# Patient Record
Sex: Male | Born: 2000 | Race: White | Hispanic: No | Marital: Single | Smoking: Current every day smoker
Health system: Southern US, Community
[De-identification: ages and names within clinical notes are randomized; demographics above are authoritative.]

---
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
## 2020-11-17 IMAGING — DX DG CHEST 2V
2 series · 2 of 2 positions shown · non-contrast
Comparison: None.

CLINICAL DATA: Shortness of breath, fever and cough for 11 days.

EXAM:
CHEST - 2 VIEW

[chest pa]
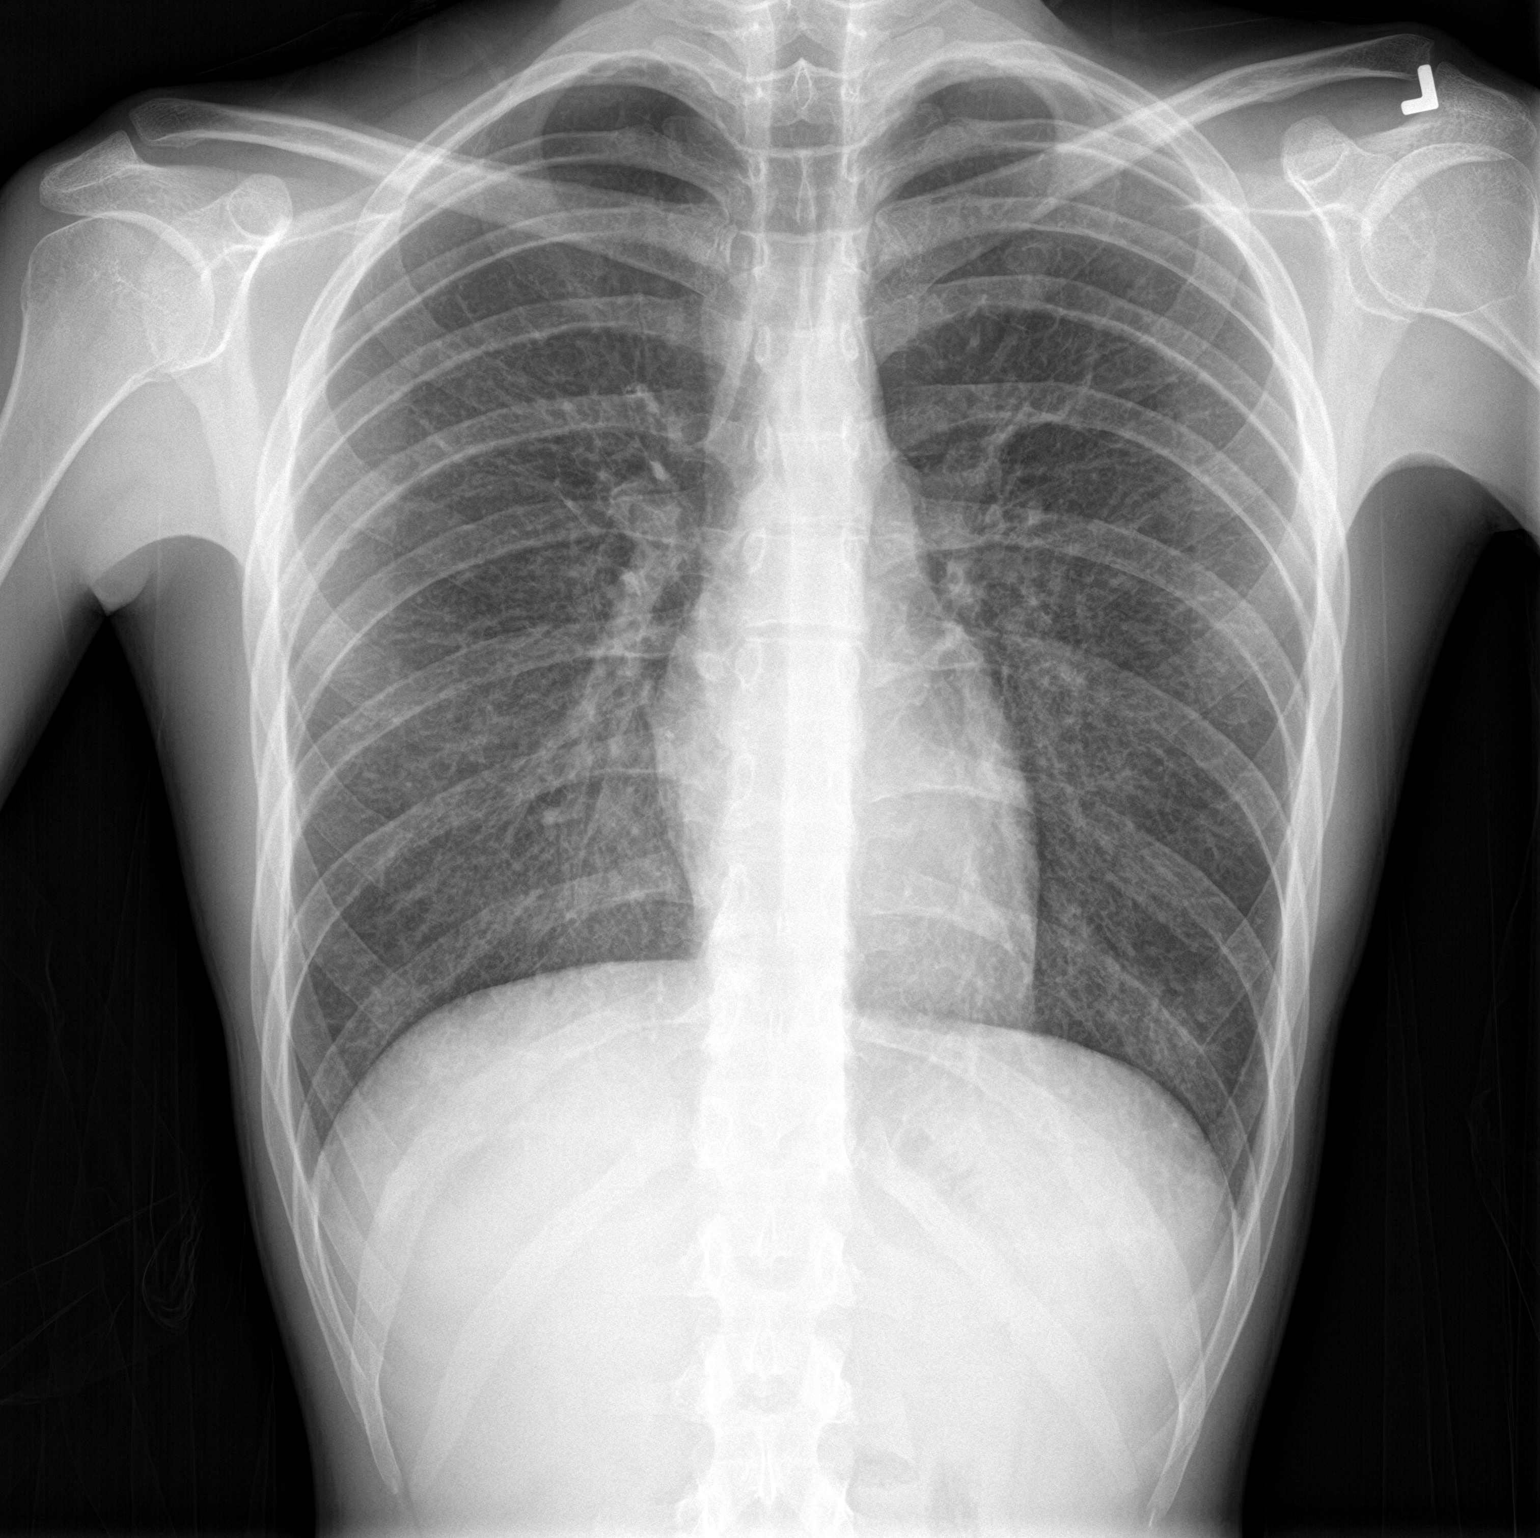

[chest lat]
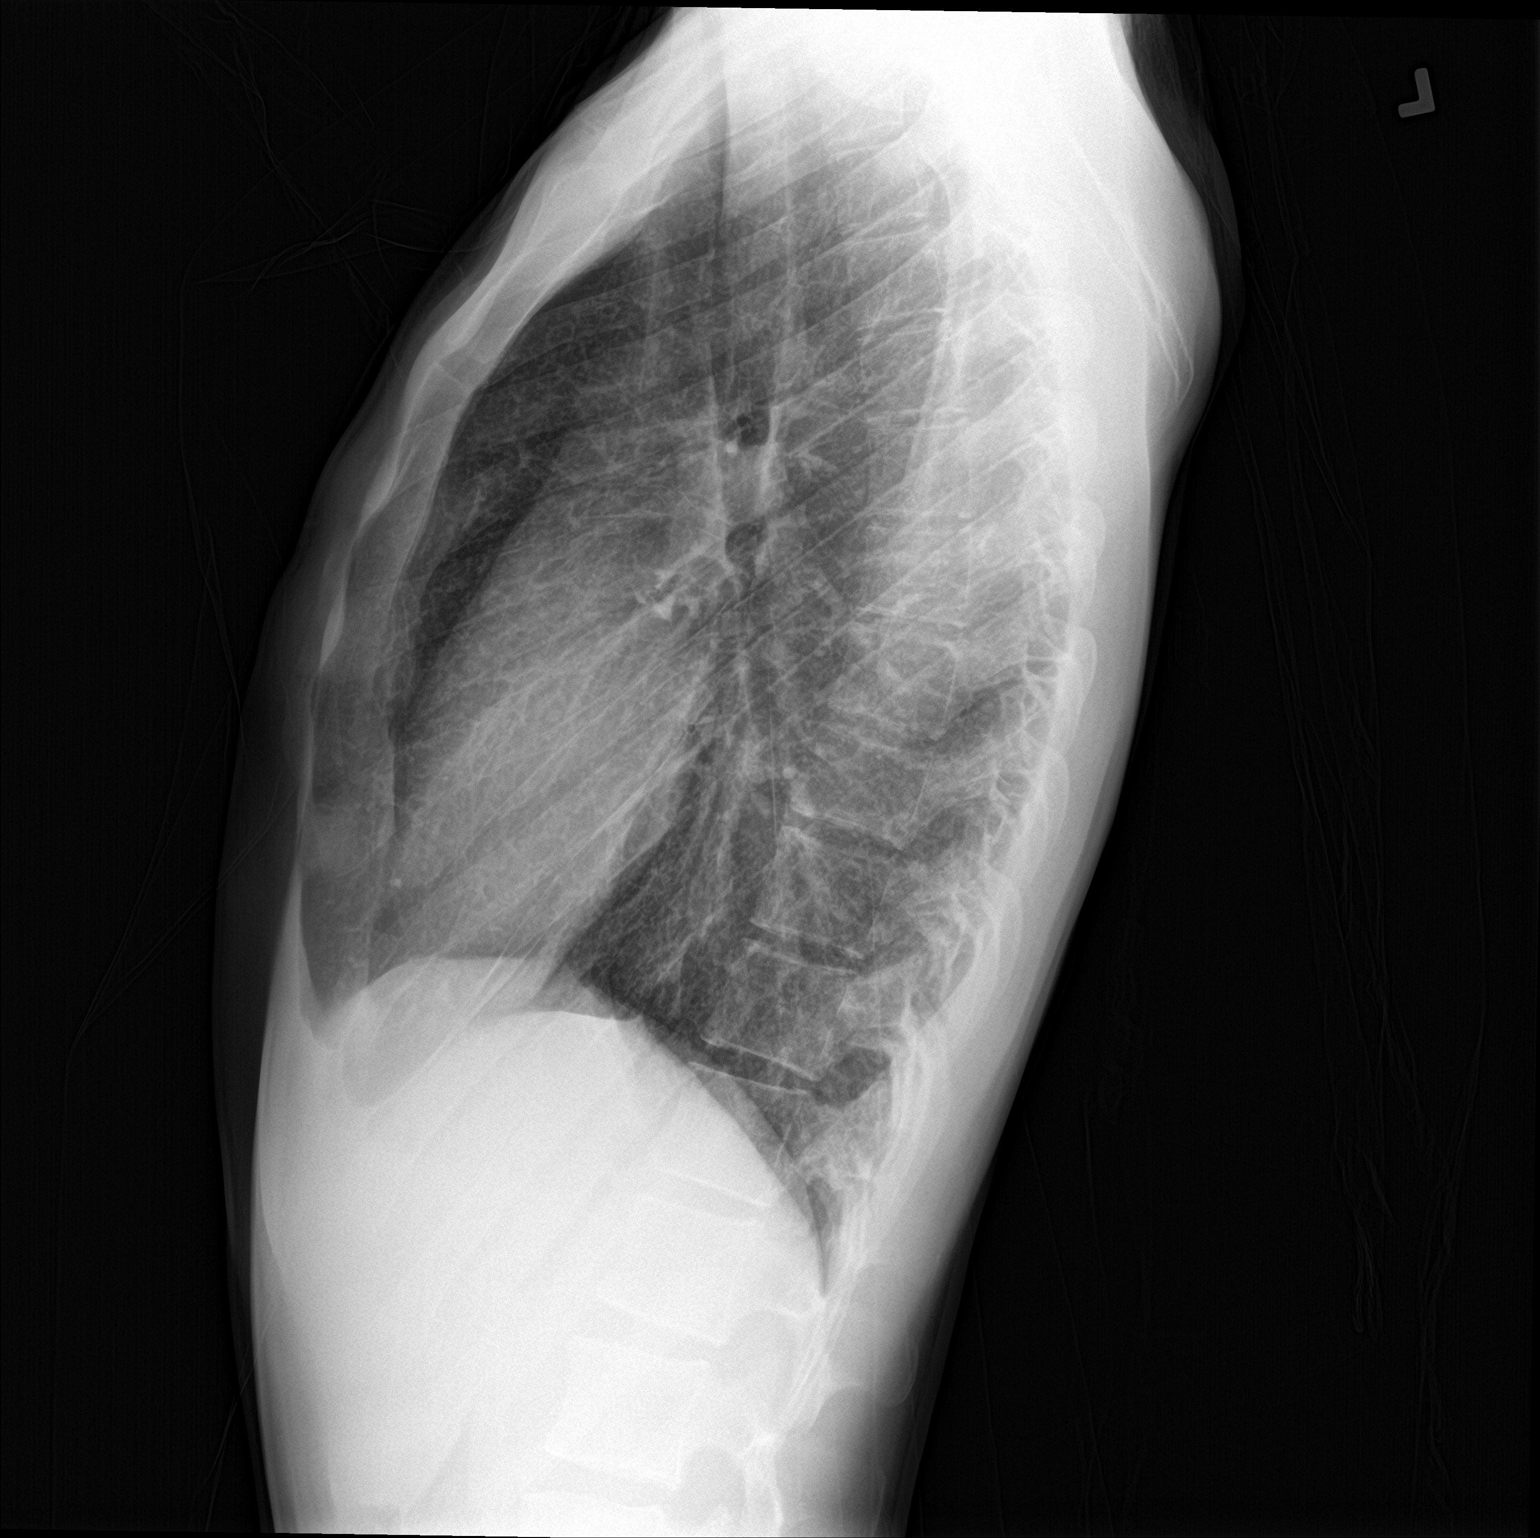

[2 of 2 positions shown; findings below may reference images not displayed]

FINDINGS: The lungs are clear. Peribronchial thickening is identified. No
pneumothorax or pleural fluid. No acute or focal bony abnormality.
IMPRESSION: Findings compatible with bronchitis.  Otherwise negative.

## 2023-06-20 ENCOUNTER — Other Ambulatory Visit: Payer: Self-pay

## 2023-06-20 ENCOUNTER — Ambulatory Visit (INDEPENDENT_AMBULATORY_CARE_PROVIDER_SITE_OTHER): Payer: Self-pay

## 2023-06-20 ENCOUNTER — Ambulatory Visit
Admission: RE | Admit: 2023-06-20 | Discharge: 2023-06-20 | Disposition: A | Discharge status: Home / Self Care | Payer: Self-pay | Source: Ambulatory Visit | Attending: Family Medicine | Admitting: Family Medicine

## 2023-06-20 VITALS — BP 98/64 | HR 59 | Temp 98.5°F | Resp 16

## 2023-06-20 DIAGNOSIS — S90931A Unspecified superficial injury of right great toe, initial encounter: Secondary | ICD-10-CM

## 2023-06-20 DIAGNOSIS — S92424A Nondisplaced fracture of distal phalanx of right great toe, initial encounter for closed fracture: Secondary | ICD-10-CM

## 2023-06-20 NOTE — ED Provider Notes (Addendum)
 [facility]    CSN: [id number] Arrival date & time: 06/20/23  0915      History   Chief Complaint Chief Complaint  Patient presents with   Foot Injury    Great Toe, RT    HPI Marcus Aguirre is a 23 y.o. male.   Patient states he dropped a heavy piece of marble on his foot yesterday.  His right great toe is swollen, painful, and bruised for x-rays Patient states this happened at work but he did not reported.  I recommended that he reported to his supervisor so that he could obtain appropriate light duty and care    History reviewed. No pertinent past medical history.  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 No pertinent surgical history.     Home Medications    Prior to Admission medications   Medication Sig Start Date End Date Taking? Authorizing Provider  ibuprofen (ADVIL) 200 MG tablet Take 200 mg by mouth every 6 (six) hours as needed.    [provider]    Family History Family History  Problem Relation Age of Onset   Healthy Father     Social History Social History   Tobacco Use   Smoking status: Every Day    Types: E-cigarettes   Smokeless tobacco: Never  Vaping Use   Vaping status: Some Days   Substances: THC  Substance Use Topics   Alcohol use: Not Currently   Drug use: Not Currently     Allergies   Patient has no known allergies.   Review of Systems Review of Systems See HPI  Physical Exam Triage Vital Signs ED Triage Vitals  Encounter Vitals Group     BP --      Systolic BP Percentile --      Diastolic BP Percentile --      Pulse Rate 06/20/23 0923 (!) 59     Resp 06/20/23 0923 16     Temp 06/20/23 0923 98.5 F (36.9 C)     Temp Source 06/20/23 0923 Oral     SpO2 06/20/23 0923 97 %     Weight --      Height --      Head Circumference --      Peak Flow --      Pain Score 06/20/23 0929 4     Pain Loc --      Pain Education --       Exclude from Growth Chart --    No data found.  Updated Vital Signs BP 98/64   Pulse (!) 59   Temp 98.5 F (36.9 C) (Oral)   Resp 16   SpO2 97%       Physical Exam Musculoskeletal:       Feet:      UC Treatments / Results  Labs (all labs ordered are listed, but only abnormal results are displayed) Labs Reviewed - No data to display  EKG   Radiology DG Toe Great Right Result Date: 06/20/2023 CLINICAL DATA:  Trauma to the right great toe. EXAM: RIGHT GREAT TOE COMPARISON:  None Available. FINDINGS: Nondisplaced cortical fracture of the lateral aspect of the proximal phalanx of the great toe adjacent to the interphalangeal joint. No other acute fracture. The bones are well mineralized. No dislocation. Mild soft tissue swelling of the great toe. No radiopaque foreign object or soft tissue gas. IMPRESSION: Nondisplaced cortical fracture of the proximal phalanx  of the great toe. Electronically Signed   By: Vanetta Chou M.D.   On: 06/20/2023 10:09    Procedures Procedures (including critical care time)  Medications Ordered in UC Medications - No data to display  Initial Impression / Assessment and Plan / UC Course  I have reviewed the triage vital signs and the nursing notes.  Pertinent labs & imaging results that were available during my care of the patient were reviewed by me and considered in my medical decision making (see chart for details).     Toe fracture discussed.  Needs follow-up with Worker's Comp. Final Clinical Impressions(s) / UC Diagnoses   Final diagnoses:  Closed nondisplaced fracture of distal phalanx of right great toe, initial encounter     Discharge Instructions      You need to wear a fracture shoe while walking Buddy tape toes Take Tylenol or ibuprofen for pain See an orthopedic in follow-up I recommend you report this to your employer     ED Prescriptions   None    PDMP not reviewed this encounter.   Maranda Jamee Jacob,  MD 06/20/23 1011    Maranda Jamee Jacob, MD 06/20/23 [phone]

## 2023-06-20 NOTE — Discharge Instructions (Signed)
 You need to wear a fracture shoe while walking Buddy tape toes Take Tylenol or ibuprofen for pain See an orthopedic in follow-up I recommend you report this to your employer

## 2023-06-20 NOTE — ED Triage Notes (Addendum)
 Pt c/o injury to RT great toe that occurred yesterday. Says he dropped a marble tile on it. Bruising noted.

## 2023-10-12 DIAGNOSIS — M79671 Pain in right foot: Secondary | ICD-10-CM | POA: Diagnosis not present

## 2023-10-12 DIAGNOSIS — S92404S Nondisplaced unspecified fracture of right great toe, sequela: Secondary | ICD-10-CM | POA: Diagnosis not present

## 2023-10-12 DIAGNOSIS — M25674 Stiffness of right foot, not elsewhere classified: Secondary | ICD-10-CM | POA: Diagnosis not present

## 2023-10-12 DIAGNOSIS — S90111S Contusion of right great toe without damage to nail, sequela: Secondary | ICD-10-CM | POA: Diagnosis not present

## 2023-11-30 ENCOUNTER — Ambulatory Visit: Payer: Self-pay | Admitting: Urgent Care

## 2023-11-30 VITALS — BP 115/73 | HR 60 | Resp 18 | Ht 72.0 in | Wt 108.0 lb

## 2023-11-30 DIAGNOSIS — Z7689 Persons encountering health services in other specified circumstances: Secondary | ICD-10-CM | POA: Diagnosis not present

## 2023-11-30 DIAGNOSIS — Z113 Encounter for screening for infections with a predominantly sexual mode of transmission: Secondary | ICD-10-CM

## 2023-11-30 DIAGNOSIS — Z23 Encounter for immunization: Secondary | ICD-10-CM | POA: Diagnosis not present

## 2023-11-30 NOTE — Patient Instructions (Signed)
 We completed labs for your today. We updated your tetanus vaccine today. Try to cut back on nicotine.  Congrats on the baby!!  Return annually or as needed

## 2023-11-30 NOTE — Progress Notes (Signed)
 New Patient Office Visit  Subjective:  Patient ID: Marcus Aguirre, male    DOB: 2000/09/13  Age: 23 y.o. MRN: [id number]  CC:  Chief Complaint  Patient presents with   New Patient (Initial Visit)    Est. Care pt would like to have blood work done     HPI Marcus Aguirre presents to establish care.  Discussed the use of AI scribe software for clinical note transcription with the patient, who gave verbal consent to proceed.  History of Present Illness   Marcus Aguirre is a 23 year old male who presents for establishing care and routine blood work. He is accompanied by his girlfriend.  He has no specific medical concerns and is not currently on any prescription medications. He is interested in confirming his vaccination status, particularly for the tetanus vaccine, and requests an STD screening as a precautionary measure, without any known exposure or symptoms.  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 He enjoys playing video games and driving as hobbies. He maintains a diet of approximately three meals a day, though not consistently, and generally gets six to eight hours of sleep, despite having a one-month-old baby.  He has a history of smoking, having started at age 62. Currently, he vapes frequently and smokes marijuana occasionally, though he has reduced his marijuana use recently. He wants to quit vaping eventually. He denies any alcohol use and has no history of illicit drug use.  He has a birthmark on his neck, present since birth, which is not painful or growing. His father had a similar birthmark, which was removed when he was younger.  No respiratory issues, history of asthma, or recent weight loss. No known allergies.       Outpatient Encounter Medications as of 11/30/2023  Medication Sig   ibuprofen (ADVIL) 200 MG tablet Take 200 mg by mouth every 6 (six) hours as needed.   No facility-administered encounter medications on file as of 11/30/2023.     History reviewed. No pertinent past medical history.  History reviewed. No pertinent surgical history.  Family History  Problem Relation Age of Onset   Healthy Father     Social History   Socioeconomic History   Marital status: Single    Spouse name: Not on file   Number of children: Not on file   Years of education: Not on file   Highest education level: Not on file  Occupational History   Not on file  Tobacco Use   Smoking status: Every Day    Types: E-cigarettes   Smokeless tobacco: Never  Vaping Use   Vaping status: Some Days   Substances: THC  Substance and Sexual Activity   Alcohol use: Not Currently   Drug use: Not Currently   Sexual activity: Not on file  Other Topics Concern   Not on file  Social History Narrative   Not on file   Social Drivers of Health   Financial Resource Strain: Not on file  Food Insecurity: Not on file  Transportation Needs: Not on file  Physical Activity: Not on file  Stress: Not on file  Social Connections: Not on file  Intimate Partner Violence: Not on file    ROS: as noted in HPI  Objective:  BP 115/73 (BP Location: Left Arm, Patient Position: Sitting, Cuff Size: Normal)   Pulse 60   Resp 18   Ht 6' (1.829 m)   Wt 108 lb (49 kg)   SpO2 99%  BMI 14.65 kg/m   Physical Exam Vitals and nursing note reviewed.  Constitutional:      General: He is not in acute distress.    Appearance: Normal appearance. He is not ill-appearing, toxic-appearing or diaphoretic.     Comments: Very thin  HENT:     Head: Normocephalic and atraumatic.     Right Ear: Tympanic membrane, ear canal and external ear normal. There is no impacted cerumen.     Left Ear: Tympanic membrane, ear canal and external ear normal. There is no impacted cerumen.     Nose: Nose normal.     Mouth/Throat:     Mouth: Mucous membranes are moist.     Pharynx: Oropharynx is clear. No oropharyngeal exudate or posterior oropharyngeal erythema.   Eyes:      General: No scleral icterus.       Right eye: No discharge.        Left eye: No discharge.     Extraocular Movements: Extraocular movements intact.     Pupils: Pupils are equal, round, and reactive to light.   Neck:     Thyroid: No thyroid mass, thyromegaly or thyroid tenderness.   Cardiovascular:     Rate and Rhythm: Normal rate and regular rhythm.     Pulses: Normal pulses.     Heart sounds: No murmur heard. Pulmonary:     Effort: Pulmonary effort is normal. No respiratory distress.     Breath sounds: Normal breath sounds. No stridor. No wheezing or rhonchi.  Abdominal:     General: Abdomen is flat. Bowel sounds are normal. There is no distension.     Palpations: Abdomen is soft. There is no mass.     Tenderness: There is no abdominal tenderness. There is no guarding.   Musculoskeletal:     Cervical back: Normal range of motion and neck supple. No rigidity or tenderness.     Right lower leg: No edema.     Left lower leg: No edema.  Lymphadenopathy:     Cervical: No cervical adenopathy.   Skin:    General: Skin is warm and dry.     Coloration: Skin is not jaundiced.     Findings: No bruising, erythema or rash.     Comments: Two pedunculated calcified skin nodules on the L neck and one on the R   Neurological:     General: No focal deficit present.     Mental Status: He is alert and oriented to person, place, and time.     Sensory: No sensory deficit.     Motor: No weakness.   Psychiatric:        Mood and Affect: Mood normal.        Behavior: Behavior normal.       Assessment & Plan:  Screen for STD (sexually transmitted disease) -     Hepatitis C antibody -     HIV Antibody (routine testing w rflx)  Encounter to establish care -     Lipid panel -     CBC -     TSH -     CMP14+EGFR  Encounter for immunization -     Tdap vaccine greater than or equal to 7yo IM  Assessment and Plan    General Health Maintenance 23 year old male establishing care.  Generally healthy, no current medical concerns. Smokes nicotine vapes, desires cessation. Reduced marijuana use. Due for vaccinations, especially tetanus due to newborn at home. Interested in STD screening for precaution. - Check vaccination  status, administer tetanus vaccine if due, emphasized pertussis prevention for newborn. - Perform STD screening. - Encouraged smoking cessation - Schedule annual wellness visits unless lab results indicate otherwise.        No follow-ups on file.   Benton LITTIE Gave, PA

## 2023-12-01 ENCOUNTER — Encounter: Payer: Self-pay | Admitting: Urgent Care

## 2023-12-01 ENCOUNTER — Ambulatory Visit: Payer: Self-pay | Admitting: Urgent Care

## 2023-12-01 LAB — CMP14+EGFR
ALT: 11 IU/L (ref 0–44)
AST: 25 IU/L (ref 0–40)
Albumin: 5.2 g/dL (ref 4.3–5.2)
Alkaline Phosphatase: 56 IU/L (ref 44–121)
BUN/Creatinine Ratio: 19 (ref 9–20)
BUN: 19 mg/dL (ref 6–20)
Bilirubin Total: 1.4 mg/dL — ABNORMAL HIGH (ref 0.0–1.2)
CO2: 22 mmol/L (ref 20–29)
Calcium: 10.1 mg/dL (ref 8.7–10.2)
Chloride: 101 mmol/L (ref 96–106)
Creatinine, Ser: 1 mg/dL (ref 0.76–1.27)
Globulin, Total: 2.7 g/dL (ref 1.5–4.5)
Glucose: 74 mg/dL (ref 70–99)
Potassium: 4.7 mmol/L (ref 3.5–5.2)
Sodium: 141 mmol/L (ref 134–144)
Total Protein: 7.9 g/dL (ref 6.0–8.5)
eGFR: 109 mL/min/{1.73_m2} (ref >59)

## 2023-12-01 LAB — CBC
Hematocrit: 46.1 % (ref 37.5–51.0)
Hemoglobin: 16 g/dL (ref 13.0–17.7)
MCH: 32 pg (ref 26.6–33.0)
MCHC: 34.7 g/dL (ref 31.5–35.7)
MCV: 92 fL (ref 79–97)
Platelets: 334 10*3/uL (ref 150–450)
RBC: 5 x10E6/uL (ref 4.14–5.80)
RDW: 12.6 % (ref 11.6–15.4)
WBC: 4.2 10*3/uL (ref 3.4–10.8)

## 2023-12-01 LAB — LIPID PANEL
Chol/HDL Ratio: 2.4 ratio (ref 0.0–5.0)
Cholesterol, Total: 194 mg/dL (ref 100–199)
HDL: 82 mg/dL (ref >39)
LDL Chol Calc (NIH): 102 mg/dL — ABNORMAL HIGH (ref 0–99)
Triglycerides: 51 mg/dL (ref 0–149)
VLDL Cholesterol Cal: 10 mg/dL (ref 5–40)

## 2023-12-01 LAB — HIV ANTIBODY (ROUTINE TESTING W REFLEX): HIV Screen 4th Generation wRfx: NONREACTIVE

## 2023-12-01 LAB — HEPATITIS C ANTIBODY: Hep C Virus Ab: NONREACTIVE

## 2023-12-01 LAB — TSH: TSH: 1.09 u[IU]/mL (ref 0.450–4.500)

## 2024-05-24 ENCOUNTER — Other Ambulatory Visit: Payer: Self-pay

## 2024-05-24 ENCOUNTER — Ambulatory Visit
Admission: EM | Admit: 2024-05-24 | Discharge: 2024-05-24 | Disposition: A | Discharge status: Home / Self Care | Attending: Family Medicine | Admitting: Family Medicine

## 2024-05-24 DIAGNOSIS — R197 Diarrhea, unspecified: Secondary | ICD-10-CM | POA: Diagnosis not present

## 2024-05-24 DIAGNOSIS — R112 Nausea with vomiting, unspecified: Secondary | ICD-10-CM | POA: Diagnosis not present

## 2024-05-24 MED ORDER — ONDANSETRON 4 MG PO TBDP
4.0000 mg | ORAL_TABLET | Freq: Once | ORAL | Status: AC
  Administered 2024-05-24: 4 mg via ORAL

## 2024-05-24 MED ORDER — ONDANSETRON 4 MG PO TBDP: 4.0000 mg | ORAL_TABLET | Freq: Three times a day (TID) | ORAL | 0 refills | Status: AC | PRN

## 2024-05-24 NOTE — ED Triage Notes (Signed)
 Pt presenting with c/o vomiting, diarrhea and headache x 1 day. Pt stated that the ate beef last night and thinks that made him sick. Pt stated he has only been able to drink Gatorade and water since last night. Pt stated that he took adiarrhea pill, unable to state  the name which was minimally effective.

## 2024-05-24 NOTE — ED Provider Notes (Incomplete)
 " [name]    CSN: [id number] Arrival date & time: 05/24/24  1837      History   Chief Complaint Chief Complaint  Patient presents with   Emesis   Diarrhea    HPI Marcus Aguirre is a 23 y.o. male.   HPI  History reviewed. No pertinent past medical history.  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 No pertinent surgical history.     Home Medications    Prior to Admission medications  Medication Sig Start Date End Date Taking? Authorizing Provider  ondansetron  (ZOFRAN -ODT) 4 MG disintegrating tablet Take 1 tablet (4 mg total) by mouth every 8 (eight) hours as needed for nausea or vomiting. Dissolve under tongue. 05/24/24  Yes Pauline Garnette LABOR, MD  ibuprofen (ADVIL) 200 MG tablet Take 200 mg by mouth every 6 (six) hours as needed.    [provider]    Family History Family History  Problem Relation Age of Onset   Healthy Father     Social History Social History[1]   Allergies   Patient has no known allergies.   Review of Systems Review of Systems   Physical Exam Triage Vital Signs ED Triage Vitals  Encounter Vitals Group     BP 05/24/24 1910 108/74     Girls Systolic BP Percentile --      Girls Diastolic BP Percentile --      Boys Systolic BP Percentile --      Boys Diastolic BP Percentile --      Pulse Rate 05/24/24 1910 94     Resp 05/24/24 1910 20     Temp 05/24/24 1910 99.9 F (37.7 C)     Temp Source 05/24/24 1910 Oral     SpO2 05/24/24 1910 95 %     Weight 05/24/24 1914 115 lb (52.2 kg)     Height 05/24/24 1914 6' (1.829 m)     Head Circumference --      Peak Flow --      Pain Score 05/24/24 1913 6     Pain Loc --      Pain Education --      Exclude from Growth Chart --    No data found.  Updated Vital Signs BP 108/74 (BP Location: Right Arm)   Pulse 94   Temp 99.9 F (37.7 C) (Oral)   Resp 20   Ht 6' (1.829 m)   Wt 52.2 kg   SpO2 95%    BMI 15.60 kg/m   Visual Acuity Right Eye Distance:   Left Eye Distance:   Bilateral Distance:    Right Eye Near:   Left Eye Near:    Bilateral Near:     Physical Exam   UC Treatments / Results  Labs (all labs ordered are listed, but only abnormal results are displayed) Labs Reviewed - No data to display  EKG   Radiology No results found.  Procedures Procedures (including critical care time)  Medications Ordered in UC Medications  ondansetron  (ZOFRAN -ODT) disintegrating tablet 4 mg (4 mg Oral Given 05/24/24 1954)    Initial Impression / Assessment and Plan / UC Course  I have reviewed the triage vital signs and the nursing notes.  Pertinent labs & imaging results that were available during my care of the patient were reviewed by me and considered in my medical decision making (see chart for details).    Administered Zofran  ODT 4mg   PO; Given Rx for same. Followup with Family Doctor if not improved in about 4 days.  Final Clinical Impressions(s) / UC Diagnoses   Final diagnoses:  Nausea vomiting and diarrhea     Discharge Instructions      Begin Pedialyte for about 8 to 12 hours until diarrhea stops, then switch to clear liquids (apple juice, clear grape juice, Jello, etc) for about 12 to 18 hours.  When improved, advance to a [employer] (Bananas, Rice, Applesauce, Toast). Then gradually resume a regular diet when tolerated.  Avoid milk products until well.     If symptoms become significantly worse during the night or over the weekend, proceed to the local emergency room.     ED Prescriptions     Medication Sig Dispense Auth. Provider   ondansetron  (ZOFRAN -ODT) 4 MG disintegrating tablet Take 1 tablet (4 mg total) by mouth every 8 (eight) hours as needed for nausea or vomiting. Dissolve under tongue. 12 tablet Pauline Garnette LABOR, MD      PDMP not reviewed this encounter.    [1]  Social History Tobacco Use   Smoking status: Every Day    Types:  E-cigarettes   Smokeless tobacco: Never  Vaping Use   Vaping status: Some Days   Substances: THC  Substance Use Topics   Alcohol use: Not Currently   Drug use: Not Currently   "

## 2024-05-24 NOTE — Discharge Instructions (Signed)
 Begin Pedialyte for about 8 to 12 hours until diarrhea stops, then switch to clear liquids (apple juice, clear grape juice, Jello, etc) for about 12 to 18 hours.  When improved, advance to a [employer] (Bananas, Rice, Applesauce, Toast). Then gradually resume a regular diet when tolerated.  Avoid milk products until well.     If symptoms become significantly worse during the night or over the weekend, proceed to the local emergency room.

## 2024-05-25 ENCOUNTER — Telehealth: Payer: Self-pay
